# Patient Record
Sex: Male | Born: 1970
Health system: Southern US, Community
[De-identification: ages and names within clinical notes are randomized; demographics above are authoritative.]

## PROBLEM LIST (undated history)

## (undated) HISTORY — PX: WISDOM TOOTH EXTRACTION: SHX21

---

## 1996-09-21 HISTORY — PX: BACK SURGERY: SHX140

## 1999-09-09 ENCOUNTER — Encounter: Payer: Self-pay | Admitting: Orthopedic Surgery

## 1999-09-09 ENCOUNTER — Ambulatory Visit (HOSPITAL_COMMUNITY): Admission: RE | Admit: 1999-09-09 | Discharge: 1999-09-09 | Payer: Self-pay | Admitting: Orthopedic Surgery

## 2008-09-21 HISTORY — PX: INGUINAL HERNIA REPAIR: SUR1180

## 2017-08-24 DIAGNOSIS — Z0000 Encounter for general adult medical examination without abnormal findings: Secondary | ICD-10-CM | POA: Diagnosis not present

## 2017-11-01 DIAGNOSIS — B338 Other specified viral diseases: Secondary | ICD-10-CM | POA: Diagnosis not present

## 2018-09-01 DIAGNOSIS — Z0000 Encounter for general adult medical examination without abnormal findings: Secondary | ICD-10-CM | POA: Diagnosis not present

## 2018-09-01 DIAGNOSIS — Z13220 Encounter for screening for lipoid disorders: Secondary | ICD-10-CM | POA: Diagnosis not present

## 2018-09-23 DIAGNOSIS — S8991XA Unspecified injury of right lower leg, initial encounter: Secondary | ICD-10-CM | POA: Insufficient documentation

## 2018-09-23 DIAGNOSIS — S8991XD Unspecified injury of right lower leg, subsequent encounter: Secondary | ICD-10-CM | POA: Diagnosis not present

## 2019-09-22 HISTORY — PX: TENDON REPAIR: SHX5111

## 2020-02-02 ENCOUNTER — Encounter: Payer: Self-pay | Admitting: Gastroenterology

## 2020-02-26 ENCOUNTER — Ambulatory Visit (INDEPENDENT_AMBULATORY_CARE_PROVIDER_SITE_OTHER): Payer: 59

## 2020-02-26 ENCOUNTER — Ambulatory Visit (INDEPENDENT_AMBULATORY_CARE_PROVIDER_SITE_OTHER): Payer: 59 | Admitting: Podiatry

## 2020-02-26 ENCOUNTER — Other Ambulatory Visit: Payer: Self-pay

## 2020-02-26 ENCOUNTER — Encounter: Payer: Self-pay | Admitting: Podiatry

## 2020-02-26 DIAGNOSIS — M778 Other enthesopathies, not elsewhere classified: Secondary | ICD-10-CM

## 2020-02-26 DIAGNOSIS — M7672 Peroneal tendinitis, left leg: Secondary | ICD-10-CM | POA: Diagnosis not present

## 2020-02-26 MED ORDER — DICLOFENAC SODIUM 75 MG PO TBEC: 75.0000 mg | DELAYED_RELEASE_TABLET | Freq: Two times a day (BID) | ORAL | 1 refills | Status: DC

## 2020-02-26 NOTE — Progress Notes (Signed)
   HPI: 49 y.o. male presenting today as a new patient for evaluation of left foot and ankle pain.  Patient has been experiencing pain for the last 6-7 months.  Patient does not recall trauma to the area.  Patient experiences swelling with pain and aching in the mornings.  He states that he has a pressure-like sensation.  He is very active and exercises regularly.  He notices the pain increased during exercise.  He has been applying topical Voltaren gel 2% with no significant improvement.  He presents for further treatment evaluation  No past medical history on file.   Physical Exam: General: The patient is alert and oriented x3 in no acute distress.  Dermatology: Skin is warm, dry and supple bilateral lower extremities. Negative for open lesions or macerations.  Vascular: Palpable pedal pulses bilaterally.  Mild edema noted compared to the contralateral limb.  Capillary refill within normal limits.  Neurological: Epicritic and protective threshold grossly intact bilaterally.   Musculoskeletal Exam: Range of motion within normal limits to all pedal and ankle joints bilateral. Muscle strength 5/5 in all groups bilateral.  Tenderness to palpation along the base of the fourth metatarsal left foot.  There is also tenderness to palpation at the peroneal tubercle of the calcaneus just distal to the fibular malleolus left foot.  No tenderness to palpation along the peroneal tendons.  Radiographic Exam:  Normal osseous mineralization. Joint spaces preserved. No fracture/dislocation/boney destruction.  No evidence of stress reaction fracture based on radiographic exam  Assessment: 1.  Suspect stress fracture fourth metatarsal left foot based on clinical exam 2.  Peroneal tendinitis of the peroneal tubercle left foot   Plan of Care:  1. Patient evaluated. X-Rays reviewed.  2.  Injection of 0.5 cc Celestone Soluspan and light injected into the dorsal lateral aspect of the left midfoot as well as along  the peroneal tendon sheath left 3.  Prescription for diclofenac 75 mg 2 times daily 4.  The patient only experiences significant pain during activity.  We will avoid putting him in an immobilization cam boot at the moment.  Recommend good supportive tennis shoes 5.  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  58-year-old boy.  89-month-old girl.      Edrick Kins, DPM [facility]  Dr. Edrick Kins, DPM    [address], [place] [address]                Office [phone]  Fax [phone]

## 2020-03-04 ENCOUNTER — Telehealth: Payer: Self-pay | Admitting: Podiatry

## 2020-03-04 NOTE — Telephone Encounter (Signed)
Stop the medicine and prescribe Mobic 7.5 mg tablet  BID.

## 2020-03-04 NOTE — Telephone Encounter (Signed)
Pt called stating that the medication that evans gave  Diclofenac pt is experiencing light head and did stop the medication on 03/02/20 would like something else to take if possible

## 2020-03-05 MED ORDER — MELOXICAM 7.5 MG PO TABS: 7.5000 mg | ORAL_TABLET | Freq: Every day | ORAL | 1 refills | Status: DC

## 2020-03-05 NOTE — Addendum Note (Signed)
Addended by: Harriett Sine D on: 03/05/2020 08:02 AM   Modules accepted: Orders

## 2020-03-05 NOTE — Telephone Encounter (Signed)
Left message informing pt of Dr. Burnell Blanks orders and to take at bedtime.

## 2020-03-06 ENCOUNTER — Encounter: Payer: Self-pay | Admitting: Gastroenterology

## 2020-05-08 ENCOUNTER — Other Ambulatory Visit: Payer: Self-pay | Admitting: Podiatry

## 2020-05-08 NOTE — Telephone Encounter (Signed)
refill 

## 2020-05-29 ENCOUNTER — Ambulatory Visit: Payer: 59 | Admitting: Podiatry

## 2020-06-04 ENCOUNTER — Other Ambulatory Visit: Payer: Self-pay | Admitting: Podiatry

## 2020-06-05 NOTE — Telephone Encounter (Signed)
Please advise 

## 2020-06-10 NOTE — Telephone Encounter (Signed)
Please advise 

## 2020-07-15 ENCOUNTER — Ambulatory Visit (INDEPENDENT_AMBULATORY_CARE_PROVIDER_SITE_OTHER): Payer: 59 | Admitting: Podiatry

## 2020-07-15 ENCOUNTER — Other Ambulatory Visit: Payer: Self-pay

## 2020-07-15 DIAGNOSIS — S93402A Sprain of unspecified ligament of left ankle, initial encounter: Secondary | ICD-10-CM

## 2020-07-15 DIAGNOSIS — M7672 Peroneal tendinitis, left leg: Secondary | ICD-10-CM | POA: Diagnosis not present

## 2020-07-15 MED ORDER — MELOXICAM 15 MG PO TABS: 15.0000 mg | ORAL_TABLET | Freq: Every day | ORAL | 1 refills | Status: DC

## 2020-07-15 NOTE — Progress Notes (Signed)
   HPI: 49 y.o. male presenting today for follow-up evaluation of left foot and ankle pain.  Patient states that the injection helped significantly with his left foot and ankle pain however over the past month he went on half marathon.  After completion of the marathon he did well with some moderate soreness however this past Wednesday, 07/10/2020, he was on a short run when he heard an audible pop and felt a pop to his left ankle lateral aspect.  Immediately he noticed severe pain and tenderness.  He presents for further treatment and evaluation.  Today he is wearing tennis shoes and walking with an antalgic gait  No past medical history on file.   Physical Exam: General: The patient is alert and oriented x3 in no acute distress.  Dermatology: Skin is warm, dry and supple bilateral lower extremities. Negative for open lesions or macerations.  Vascular: Palpable pedal pulses bilaterally.  Mild edema noted compared to the contralateral limb.  Capillary refill within normal limits.  Neurological: Epicritic and protective threshold grossly intact bilaterally.   Musculoskeletal Exam: Range of motion within normal limits to all pedal and ankle joints bilateral. Muscle strength 5/5 in all groups bilateral.  Tenderness to palpation along the base of the fourth metatarsal left foot extending up to the fibular malleolus.  There is also tenderness to palpation at the peroneal tubercle of the calcaneus just distal to the fibular malleolus left foot.  No tenderness to palpation along the peroneal tendons.  Assessment: 1.  Acute on chronic ankle pain left 2.  Peroneal tendinitis of the peroneal tubercle left foot  Plan of Care:  1. Patient evaluated.   2.  Today we are going to order an MRI of the left ankle to rule out any ligamentous or tendon injury.  Medically necessary. 3.  Refill prescription for meloxicam 15 mg daily 4.  In the meantime wear good supportive shoes and continue conservative treatment  including icing and elevation with rest 5.  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  77-year-old boy.  19-month-old girl.  Wife is from [place]      Edrick Kins, DPM [facility]  Dr. Edrick Kins, DPM    [address], [place] [address]                Office [phone]  Fax [phone]

## 2020-07-25 ENCOUNTER — Other Ambulatory Visit: Payer: Self-pay

## 2020-07-25 ENCOUNTER — Ambulatory Visit
Admission: RE | Admit: 2020-07-25 | Discharge: 2020-07-25 | Disposition: A | Discharge status: Home / Self Care | Payer: 59 | Source: Ambulatory Visit | Attending: Podiatry | Admitting: Podiatry

## 2020-07-25 DIAGNOSIS — S93402A Sprain of unspecified ligament of left ankle, initial encounter: Secondary | ICD-10-CM

## 2020-07-25 DIAGNOSIS — M7672 Peroneal tendinitis, left leg: Secondary | ICD-10-CM

## 2020-07-26 ENCOUNTER — Other Ambulatory Visit: Payer: 59

## 2020-07-31 ENCOUNTER — Telehealth: Payer: Self-pay

## 2020-07-31 ENCOUNTER — Ambulatory Visit (INDEPENDENT_AMBULATORY_CARE_PROVIDER_SITE_OTHER): Payer: 59 | Admitting: Podiatry

## 2020-07-31 DIAGNOSIS — M7672 Peroneal tendinitis, left leg: Secondary | ICD-10-CM | POA: Diagnosis not present

## 2020-07-31 DIAGNOSIS — S86312A Strain of muscle(s) and tendon(s) of peroneal muscle group at lower leg level, left leg, initial encounter: Secondary | ICD-10-CM | POA: Diagnosis not present

## 2020-07-31 NOTE — Progress Notes (Signed)
   HPI: 49 y.o. male presenting today for follow-up evaluation of left lateral foot pain. Patient had MRI  No past medical history on file.   Physical Exam: General: The patient is alert and oriented x3 in no acute distress.  Dermatology: Skin is warm, dry and supple bilateral lower extremities. Negative for open lesions or macerations.  Vascular: Palpable pedal pulses bilaterally.  Mild edema noted compared to the contralateral limb.  Capillary refill within normal limits.  Neurological: Epicritic and protective threshold grossly intact bilaterally.   Musculoskeletal Exam: Range of motion within normal limits to all pedal and ankle joints bilateral. Muscle strength 5/5 in all groups bilateral.  Tenderness to palpation along the base of the fourth metatarsal left foot extending up to the fibular malleolus.  There is also tenderness to palpation at the peroneal tubercle of the calcaneus just distal to the fibular malleolus left foot.  No tenderness to palpation along the peroneal tendons.  MRI Impression taken 07/25/2020:  Peroneal: Peroneal longus tendinosis with high-grade near complete tear spanning a length of 3.4 cm from just inferior to the lateral malleolus to just distal to the peroneal tubercle. Peroneal brevis Intact. 1. Peroneal longus tendinosis with high-grade near complete tear spanning a length of 3.4 cm from just inferior to the lateral malleolus to just distal to the peroneal tubercle.  Assessment: 1.  Acute on chronic ankle pain left 2.  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 Patient evaluated.  MRI reviewed today 2. Today we discussed the conservative versus surgical management of the presenting pathology. The patient opts for surgical management. All possible complications and details of the procedure were explained. All patient questions were answered. No guarantees were expressed or implied. 3. Authorization for surgery was initiated  today. Surgery will consist of primary repair of peroneus longus tendon with possible application of amniotic tissue graft 4.  Return to clinic 1 week postop   *Stay-at-home dad.  36-year-old boy.  20-month-old girl.  Wife is from [place]      Edrick Kins, DPM [facility]  Dr. Edrick Kins, DPM    [address], [place] [address]                Office [phone]  Fax [phone]

## 2020-07-31 NOTE — Telephone Encounter (Signed)
DOS 08/01/2020  REPAIR TENDON LT - 28086  [employer] EFFECTIVE DATE - 01/12/2020  PLAN DEDUCTIBLE - $0.00 OUT OF POCKET - $0.00 COPAY $0.00 COINSURANCE - 20%  Notification or Prior Authorization is not required for the requested services  This [employer] plan does not currently require a prior authorization for these services. If you have general questions about the prior authorization requirements, please call us at [phone] or visit [email or web address] > Clinician Resources > Advance and Admission Notification Requirements. The number above acknowledges your notification. Please write this number down for future reference. Notification is not a guarantee of coverage or payment.  Decision ID #:[id number]

## 2020-08-01 ENCOUNTER — Other Ambulatory Visit: Payer: Self-pay | Admitting: Podiatry

## 2020-08-01 DIAGNOSIS — M7672 Peroneal tendinitis, left leg: Secondary | ICD-10-CM

## 2020-08-01 DIAGNOSIS — S86312A Strain of muscle(s) and tendon(s) of peroneal muscle group at lower leg level, left leg, initial encounter: Secondary | ICD-10-CM

## 2020-08-01 MED ORDER — OXYCODONE-ACETAMINOPHEN 5-325 MG PO TABS: 1.0000 | ORAL_TABLET | ORAL | 0 refills | Status: DC | PRN

## 2020-08-01 NOTE — Progress Notes (Signed)
PRN postop 

## 2020-08-07 ENCOUNTER — Ambulatory Visit (INDEPENDENT_AMBULATORY_CARE_PROVIDER_SITE_OTHER): Payer: 59 | Admitting: Podiatry

## 2020-08-07 ENCOUNTER — Other Ambulatory Visit: Payer: Self-pay

## 2020-08-07 ENCOUNTER — Ambulatory Visit (INDEPENDENT_AMBULATORY_CARE_PROVIDER_SITE_OTHER): Payer: 59

## 2020-08-07 DIAGNOSIS — M7672 Peroneal tendinitis, left leg: Secondary | ICD-10-CM

## 2020-08-07 DIAGNOSIS — S86312A Strain of muscle(s) and tendon(s) of peroneal muscle group at lower leg level, left leg, initial encounter: Secondary | ICD-10-CM

## 2020-08-07 DIAGNOSIS — Z9889 Other specified postprocedural states: Secondary | ICD-10-CM

## 2020-08-07 NOTE — Progress Notes (Signed)
   Subjective:  Patient presents today status post peroneus longus tendon repair/tenodesis left ankle. DOS: 08/01/2020.  Patient states that he is doing very well.  He has been nonweightbearing in the cam boot with the assistance of crutches.  He states that he currently is not taking any opioid medication.  Otherwise doing very well and he presents for further treatment and evaluation.  No new complaints at this time  No past medical history on file.    Objective/Physical Exam Neurovascular status intact.  Skin incisions appear to be well coapted with staples intact. No sign of infectious process noted. No dehiscence. No active bleeding noted.  Some moderate ecchymosis noted throughout the foot and ankle.  Moderate edema noted to the surgical extremity.  Radiographic Exam:  Otherwise normal exam.  Joint spaces preserved.  No new findings  Assessment: 1. s/p peroneal tenodesis secondary to peroneus longus rupture left. DOS: 08/01/2020   Plan of Care:  1. Patient was evaluated. X-rays reviewed 2.  Continue nonweightbearing in the cam boot with the assistance of crutches 3.  Dressings applied today.  Keep clean dry and intact x1 week 4.  Return to clinic in 1 week for staple removal  *Stay-at-home dad.  49-year-old boy.  50-month-old girl.  Wife is from [place]       Edrick Kins, DPM [facility]  Dr. Edrick Kins, DPM    [address], [place] [address]                Office [phone]  Fax [phone]

## 2020-08-14 ENCOUNTER — Other Ambulatory Visit: Payer: Self-pay

## 2020-08-14 ENCOUNTER — Ambulatory Visit (INDEPENDENT_AMBULATORY_CARE_PROVIDER_SITE_OTHER): Payer: 59 | Admitting: Podiatry

## 2020-08-14 DIAGNOSIS — S86312A Strain of muscle(s) and tendon(s) of peroneal muscle group at lower leg level, left leg, initial encounter: Secondary | ICD-10-CM

## 2020-08-14 DIAGNOSIS — M7672 Peroneal tendinitis, left leg: Secondary | ICD-10-CM

## 2020-08-14 DIAGNOSIS — Z9889 Other specified postprocedural states: Secondary | ICD-10-CM

## 2020-08-20 ENCOUNTER — Telehealth: Payer: Self-pay | Admitting: *Deleted

## 2020-08-20 NOTE — Telephone Encounter (Signed)
Patient is calling, has some concerns about his post op foot after surgery, thinks that it may be swelling more than it should.  He has an upcoming appointment next week. Should he be seen sooner?

## 2020-08-21 NOTE — Progress Notes (Signed)
   Subjective:  Patient presents today status post peroneus longus tendon repair/tenodesis left ankle. DOS: 08/01/2020.  Patient states that he is doing well.  He has been nonweightbearing in the cam boot with the assistance of crutches.  Minimal pain noted.  No new complaints at this time  No past medical history on file.    Objective/Physical Exam Neurovascular status intact.  Skin incisions appear to be well coapted with staples intact. No sign of infectious process noted. No dehiscence. No active bleeding noted.  Some moderate ecchymosis noted throughout the foot and ankle.  Moderate edema noted to the surgical extremity.  Assessment: 1. s/p peroneal tenodesis secondary to peroneus longus rupture left. DOS: 08/01/2020   Plan of Care:  1. Patient was evaluated.  Staples removed today 2.  Continue nonweightbearing in the cam boot with the assistance of crutches 3.  Recommend Ace wrap daily.  Ace wraps were provided today 4.  Return to clinic in 2 weeks to begin weightbearing in the cam boot and initiate physical therapy  *Stay-at-home dad.  49-year-old boy.  50-month-old girl.  Wife is from [place]       Edrick Kins, DPM [facility]  Dr. Edrick Kins, DPM    [address], [place] [address]                Office [phone]  Fax [phone]

## 2020-08-28 ENCOUNTER — Ambulatory Visit (INDEPENDENT_AMBULATORY_CARE_PROVIDER_SITE_OTHER): Payer: 59 | Admitting: Podiatry

## 2020-08-28 ENCOUNTER — Ambulatory Visit (INDEPENDENT_AMBULATORY_CARE_PROVIDER_SITE_OTHER): Payer: 59

## 2020-08-28 ENCOUNTER — Other Ambulatory Visit: Payer: Self-pay

## 2020-08-28 DIAGNOSIS — Z9889 Other specified postprocedural states: Secondary | ICD-10-CM

## 2020-08-28 DIAGNOSIS — M7672 Peroneal tendinitis, left leg: Secondary | ICD-10-CM

## 2020-08-28 DIAGNOSIS — S86312A Strain of muscle(s) and tendon(s) of peroneal muscle group at lower leg level, left leg, initial encounter: Secondary | ICD-10-CM

## 2020-08-28 NOTE — Progress Notes (Addendum)
   Subjective:  Patient presents today status post peroneus longus tendon repair/tenodesis left ankle. DOS: 08/01/2020.  Patient states that he is doing well.  No new complaints at this time.  He has been nonweightbearing in the cam boot using crutches as instructed. No past medical history on file.    Objective/Physical Exam Neurovascular status intact.  Skin incisions appear to be well coapted and healed. No sign of infectious process noted. No dehiscence. No active bleeding noted.  Some moderate ecchymosis noted throughout the foot and ankle.  Moderate edema noted to the surgical extremity.  Assessment: 1. s/p peroneal tenodesis secondary to peroneus longus rupture left. DOS: 08/01/2020   Plan of Care:  1. Patient was evaluated.  Patient may now transition from nonweightbearing to weightbearing in the cam boot over 2 to 3-week..  Begin passive range of motion exercises 2.  Continue Ace wrap daily 3.  Patient may begin partial weightbearing in the cam boot 4.  Order placed for physical therapy at [facility] 5.  Return to clinic in 1 month  *Stay-at-home dad.  49-year-old boy.  93-month-old girl.  Wife is from [place]       Edrick Kins, DPM [facility]  Dr. Edrick Kins, DPM    [address], [place] [address]                Office [phone]  Fax [phone]

## 2020-08-28 NOTE — Addendum Note (Signed)
Addended by: Edrick Kins on: 08/28/2020 03:09 PM   Modules accepted: Level of Service

## 2020-08-28 NOTE — Addendum Note (Signed)
Addended by: Lind Guest on: 08/28/2020 04:46 PM   Modules accepted: Orders

## 2020-09-23 ENCOUNTER — Other Ambulatory Visit: Payer: Self-pay | Admitting: Podiatry

## 2020-09-25 ENCOUNTER — Ambulatory Visit (INDEPENDENT_AMBULATORY_CARE_PROVIDER_SITE_OTHER): Payer: 59 | Admitting: Podiatry

## 2020-09-25 ENCOUNTER — Other Ambulatory Visit: Payer: Self-pay

## 2020-09-25 DIAGNOSIS — Z9889 Other specified postprocedural states: Secondary | ICD-10-CM

## 2020-09-25 DIAGNOSIS — M7672 Peroneal tendinitis, left leg: Secondary | ICD-10-CM

## 2020-09-29 NOTE — Telephone Encounter (Signed)
Please advise 

## 2020-10-13 NOTE — Progress Notes (Signed)
   Subjective:  Patient presents today status post peroneus longus tendon repair/tenodesis left ankle. DOS: 08/01/2020.  Patient states that he is doing well.  No new complaints at this time.  He continues to be weightbearing in the cam boot as instructed  No past medical history on file.    Objective/Physical Exam Neurovascular status intact.  Skin incisions appear to be well coapted and healed. No sign of infectious process noted. No dehiscence. No active bleeding noted.  Some moderate ecchymosis noted throughout the foot and ankle.  Moderate edema noted to the surgical extremity.  Assessment: 1. s/p peroneal tenodesis secondary to peroneus longus rupture left. DOS: 08/01/2020   Plan of Care:  1. Patient was evaluated.  2.  Patient may transition out of the cam boot 3.  Ankle brace dispensed.  Wear daily 4.  Continue physical therapy at home 5.  Compression ankle sleeve dispensed.  Wear daily as needed 6.  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  50-year-old boy.  51-month-old girl.  Wife is from [place]       Edrick Kins, DPM [facility]  Dr. Edrick Kins, DPM    [address], [place] [address]                Office [phone]  Fax [phone]

## 2020-10-28 ENCOUNTER — Other Ambulatory Visit: Payer: Self-pay

## 2020-10-28 ENCOUNTER — Ambulatory Visit (INDEPENDENT_AMBULATORY_CARE_PROVIDER_SITE_OTHER): Payer: 59 | Admitting: Podiatry

## 2020-10-28 DIAGNOSIS — Z9889 Other specified postprocedural states: Secondary | ICD-10-CM

## 2020-10-28 DIAGNOSIS — M7672 Peroneal tendinitis, left leg: Secondary | ICD-10-CM

## 2020-10-28 NOTE — Progress Notes (Signed)
   Subjective:  Patient presents today status post peroneus longus tendon repair/tenodesis left ankle. DOS: 08/01/2020.  Patient is doing very well.  He has been weightbearing wearing tennis shoes with an ankle brace.  He states that yesterday he was able to walk approximately 1.7 miles with minimal pain and some stiffness to the ankle.  No new complaints at this time  No past medical history on file.    Objective/Physical Exam Neurovascular status intact.  Skin incisions appear to be well coapted and healed. No sign of infectious process noted. No dehiscence. No active bleeding noted.  There continues to be some very mild edema throughout the foot ankle and leg of the surgical extremity.  Muscle strength 5/5 all compartments.  There is no pain on palpation to the peroneal tendons  Assessment: 1. s/p peroneal tenodesis secondary to peroneus longus rupture left. DOS: 08/01/2020   Plan of Care:  1. Patient was evaluated.  2.  Patient may now transition out of the ankle brace.  Recommend good supportive sneakers and shoes 3.  Patient may slowly increase walking up to 3 miles per day on flat surfaces/pavement 4.  Continue calf strengthening exercises at home 5.  Return to clinic in 2 months for final follow-up  *Going to [place], [place] mid-April *Stay-at-home dad.  50-year-old boy.  67-month-old girl.  Wife is from [place]       Edrick Kins, DPM [facility]  Dr. Edrick Kins, DPM    [address], [place] [address]                Office [phone]  Fax [phone]

## 2020-12-23 ENCOUNTER — Other Ambulatory Visit: Payer: Self-pay

## 2020-12-23 ENCOUNTER — Ambulatory Visit (INDEPENDENT_AMBULATORY_CARE_PROVIDER_SITE_OTHER): Payer: 59 | Admitting: Podiatry

## 2020-12-23 DIAGNOSIS — Z9889 Other specified postprocedural states: Secondary | ICD-10-CM | POA: Diagnosis not present

## 2020-12-23 DIAGNOSIS — M7672 Peroneal tendinitis, left leg: Secondary | ICD-10-CM | POA: Diagnosis not present

## 2020-12-23 NOTE — Progress Notes (Signed)
   Subjective:  Patient presents today status post peroneus longus tendon repair/tenodesis left ankle. DOS: 08/01/2020.  Patient is doing very well.  He is most recently began exercising and running.  He does notice some stiffness however overall there is significant improvement.  No new complaints at this time  No past medical history on file.    Objective/Physical Exam Neurovascular status intact.  Skin incisions appear to be well coapted and healed. No sign of infectious process noted. No dehiscence. No active bleeding noted.  There continues to be some very mild edema throughout the foot ankle and leg of the surgical extremity.  Muscle strength 5/5 all compartments.  There is no pain on palpation to the peroneal tendons  Assessment: 1. s/p peroneal tenodesis secondary to peroneus longus rupture left. DOS: 08/01/2020   Plan of Care:  1. Patient was evaluated.  2.  Patient may now resume full activity no restrictions in good supportive sneakers and shoes 3.  Slowly increase activity.  Explained to the patient he may never be completely 100% compared to his contralateral limb and he will have to learn his own limits when it comes to activities.  He may need to modify certain activities. 4.  Return to clinic as needed  *Stay-at-home dad.  50-year-old boy.  51-month-old girl.  Wife is from [place]       Edrick Kins, DPM [facility]  Dr. Edrick Kins, DPM    [address], [place] [address]                Office [phone]  Fax [phone]

## 2021-01-09 ENCOUNTER — Encounter: Payer: Self-pay | Admitting: Gastroenterology

## 2021-01-29 IMAGING — MR MR ANKLE*L* W/O CM
5 series · 40 of 40 positions shown · non-contrast
Comparison: Left foot x-rays dated February 26, 2020.

CLINICAL DATA: Acute on chronic lateral ankle pain. Felt a pop in
his lateral ankle while running 2 weeks ago.

EXAM:
MRI OF THE LEFT ANKLE WITHOUT CONTRAST
TECHNIQUE: Multiplanar, multisequence MR imaging of the ankle was performed. No
intravenous contrast was administered.

[Series 4: T2 fat-sat · axial · 3.0mm · 0.50mm/px · z∈[-103,+34]mm · 10 of 36 slices shown (1 of 2)]
[im 1/36]
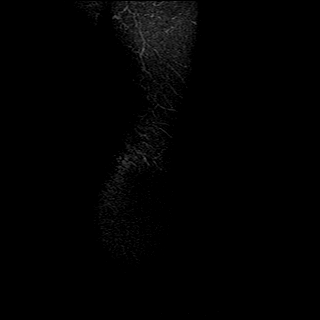
[im 4/36]
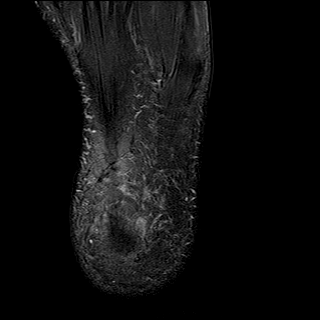
[im 8/36]
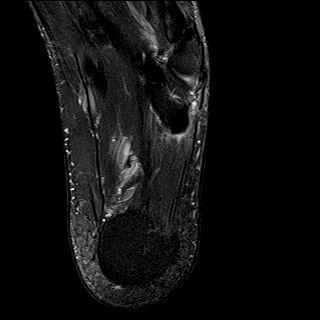
[im 12/36]
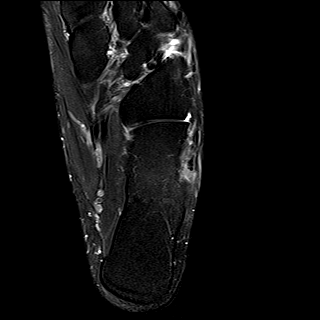
[im 16/36]
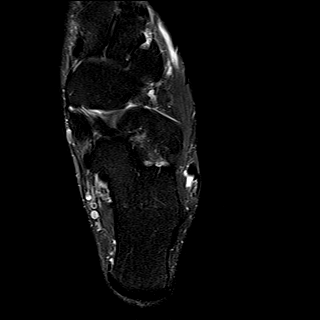
[im 20/36]
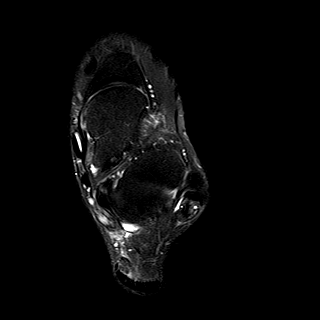
[im 24/36]
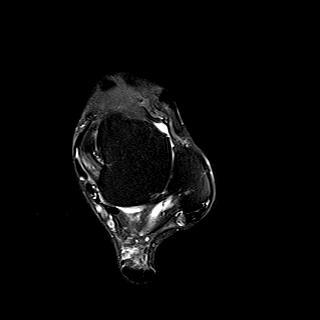
[im 28/36]
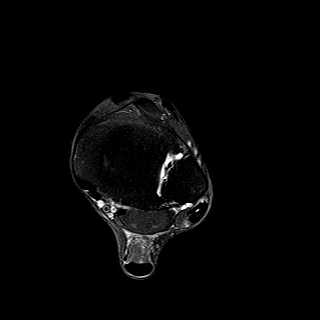
[im 32/36]
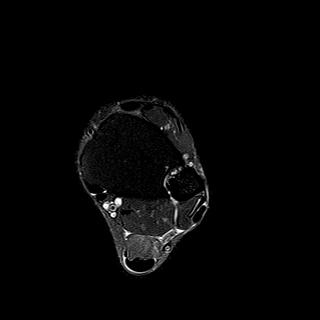
[im 36/36]
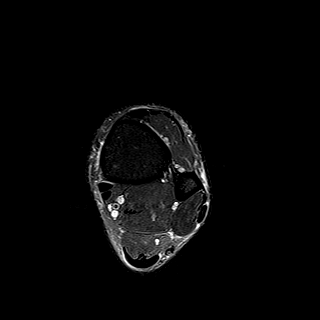

[Series 5: PD fat-sat · axial · 3.0mm · 0.50mm/px · z∈[-103,+34]mm · 10 of 36 slices shown]
[im 1/36]
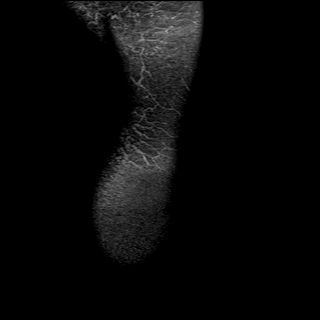
[im 4/36]
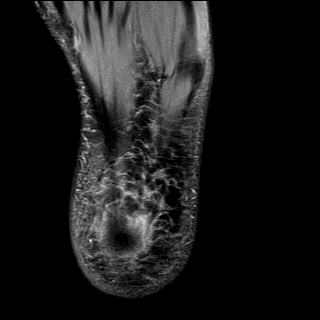
[im 8/36]
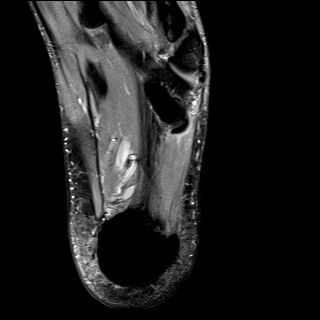
[im 12/36]
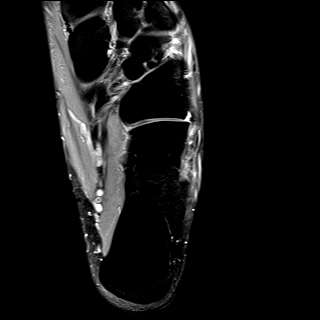
[im 16/36]
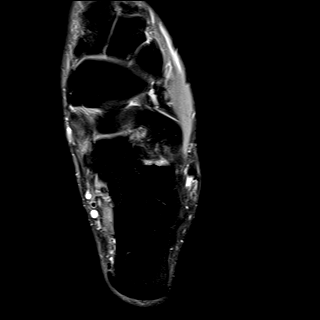
[im 20/36]
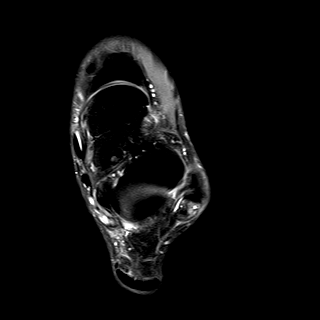
[im 24/36]
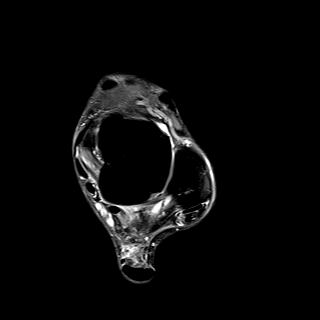
[im 28/36]
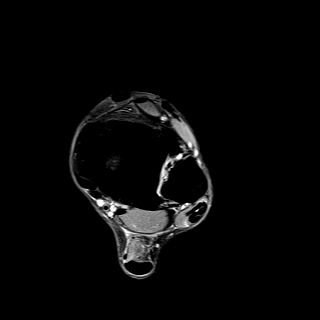
[im 32/36]
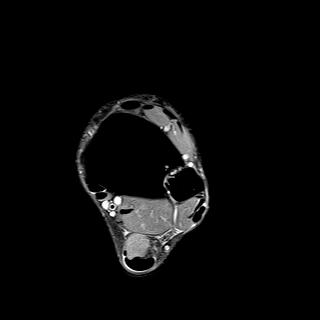
[im 36/36]
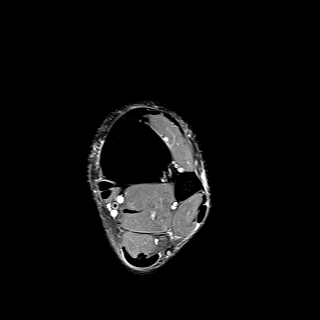

[Series 6: T1 · sagittal · 4.0mm · 0.56mm/px · 5 of 20 slices shown]
[im 1/20]
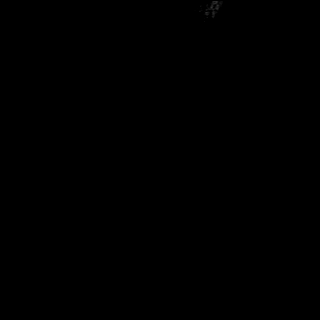
[im 5/20]
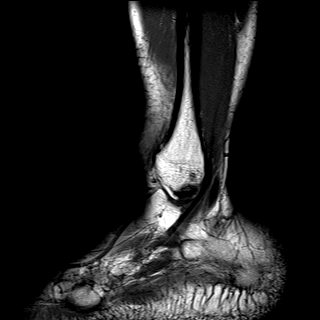
[im 10/20]
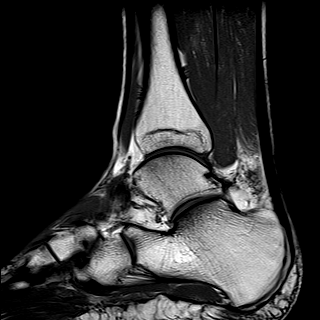
[im 15/20]
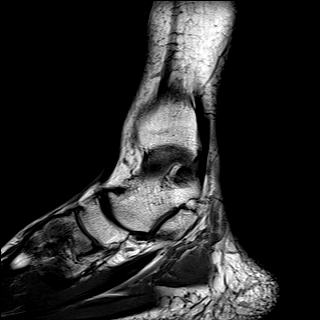
[im 20/20]
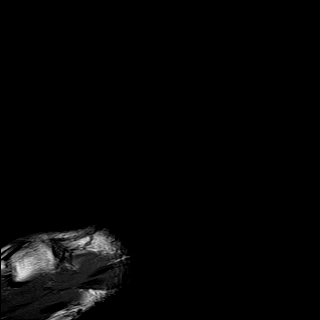

[Series 7: STIR · sagittal · 4.0mm · 0.35mm/px · 5 of 20 slices shown]
[im 1/20]
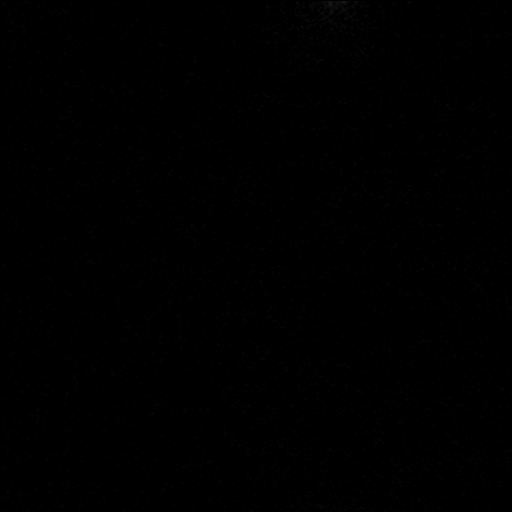
[im 5/20]
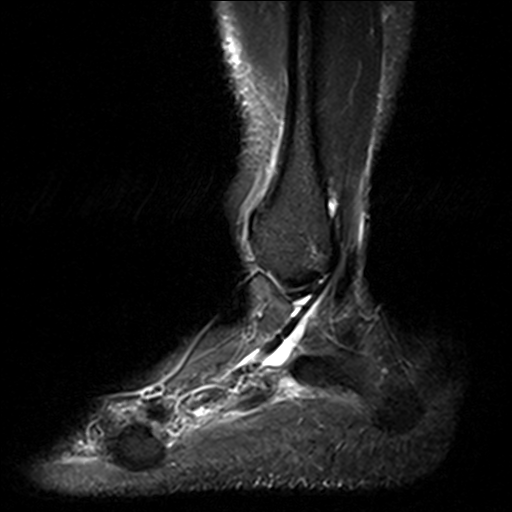
[im 10/20]
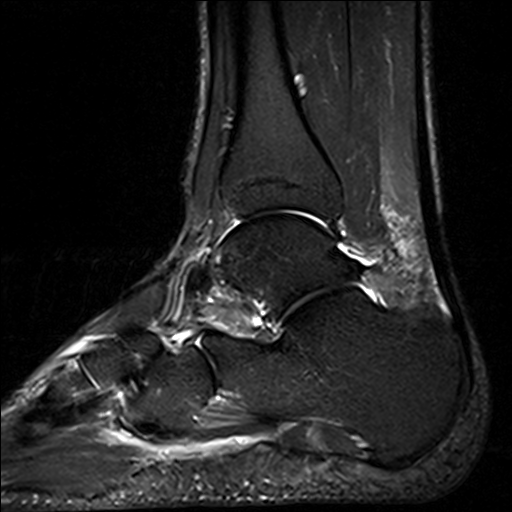
[im 15/20]
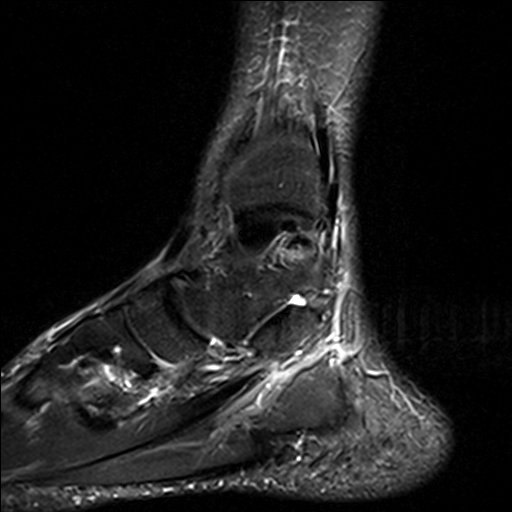
[im 20/20]
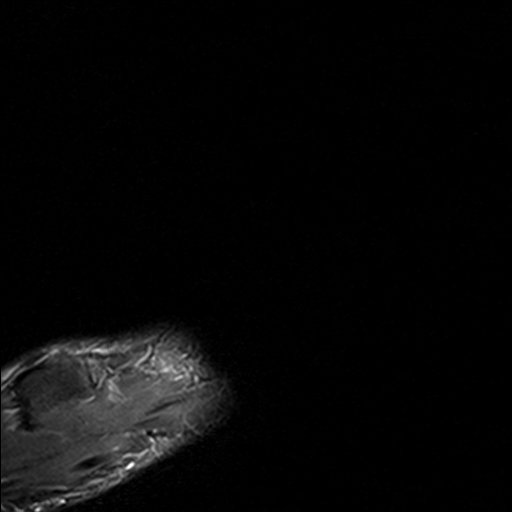

[Series 8: T2 fat-sat · coronal · 3.0mm · 0.50mm/px · 10 of 35 slices shown (2 of 2)]
[im 1/35]
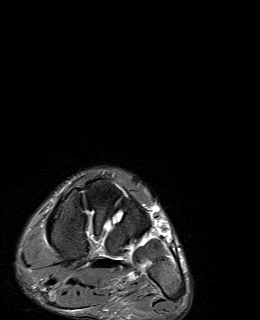
[im 4/35]
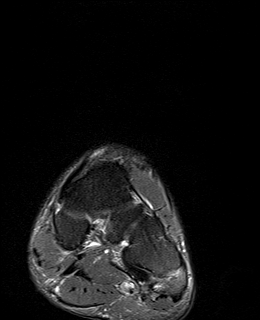
[im 8/35]
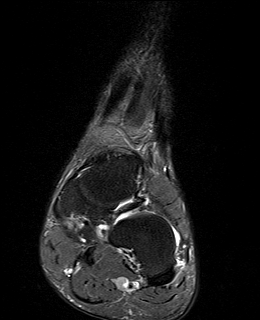
[im 12/35]
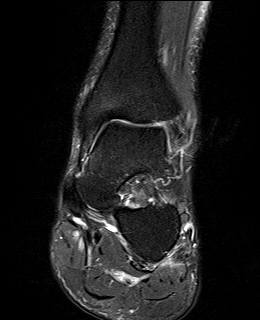
[im 16/35]
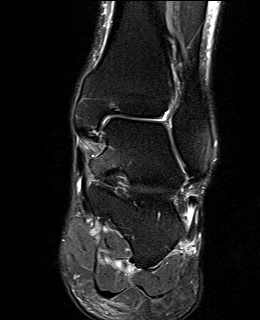
[im 19/35]
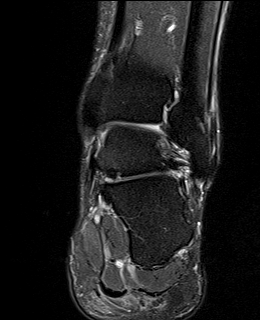
[im 23/35]
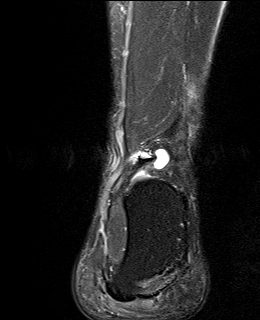
[im 27/35]
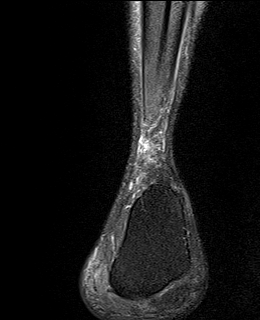
[im 31/35]
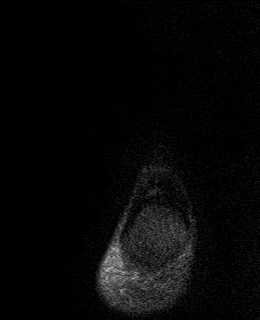
[im 35/35]
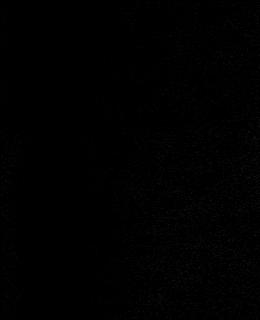

[40 of 40 positions shown; findings below may reference images not displayed]

FINDINGS: TENDONS

Peroneal: Peroneal longus tendinosis with high-grade near complete
tear spanning a length of 3.4 cm from just inferior to the lateral
malleolus to just distal to the peroneal tubercle. Peroneal brevis
intact.

Posteromedial: Posterior tibial tendon intact. Flexor hallucis
longus tendon intact. Flexor digitorum longus tendon intact.

Anterior: Tibialis anterior tendon intact. Extensor hallucis longus
tendon intact Extensor digitorum longus tendon intact.

Achilles:  Intact.

Plantar Fascia: Intact.

LIGAMENTS

Lateral: Anterior talofibular ligament intact. Calcaneofibular
ligament intact. Posterior talofibular ligament intact. Anterior and
posterior tibiofibular ligaments intact.

Medial: Deltoid ligament intact. Spring ligament intact.

CARTILAGE

Ankle Joint: No joint effusion. Normal ankle mortise. No chondral
defect.

Subtalar Joints/Sinus Tarsi: Normal subtalar joints. No subtalar
joint effusion. Normal sinus tarsi.

Bones: No marrow signal abnormality.  No fracture or dislocation.

Soft Tissue: No soft tissue mass or fluid collection.
IMPRESSION: 1. Peroneal longus tendinosis with high-grade near complete tear
spanning a length of 3.4 cm from just inferior to the lateral
malleolus to just distal to the peroneal tubercle.

## 2021-03-03 ENCOUNTER — Ambulatory Visit (AMBULATORY_SURGERY_CENTER): Payer: 59

## 2021-03-03 ENCOUNTER — Other Ambulatory Visit: Payer: Self-pay

## 2021-03-03 VITALS — Ht 73.0 in | Wt 215.0 lb

## 2021-03-03 DIAGNOSIS — Z1211 Encounter for screening for malignant neoplasm of colon: Secondary | ICD-10-CM

## 2021-03-03 MED ORDER — SUTAB 1479-225-188 MG PO TABS: 1.0000 | ORAL_TABLET | ORAL | 0 refills | Status: DC

## 2021-03-03 NOTE — Progress Notes (Signed)
Pre visit completed via phone call; Patient verified name, DOB, and address; No egg or soy allergy known to patient  No issues with past sedation with any surgeries or procedures Patient denies ever being told they had issues or difficulty with intubation  No FH of Malignant Hyperthermia No diet pills per patient No home 02 use per patient  No blood thinners per patient  Pt denies issues with constipation at this time; No A fib or A flutter  EMMI video via [place] guidelines implemented in [place] today with Pt and RN   Coupon given to pt in PV today, Code to Pharmacy and NO PA's for preps discussed with pt in PV today  Discussed with pt there will be an out-of-pocket cost for prep and that varies from $0 to 70 dollars   Due to the COVID-19 pandemic we are asking patients to follow certain guidelines.  Pt aware of COVID protocols and [facility] guidelines

## 2021-03-13 ENCOUNTER — Ambulatory Visit (AMBULATORY_SURGERY_CENTER): Payer: 59 | Admitting: Gastroenterology

## 2021-03-13 ENCOUNTER — Encounter: Payer: Self-pay | Admitting: Gastroenterology

## 2021-03-13 ENCOUNTER — Other Ambulatory Visit: Payer: Self-pay

## 2021-03-13 VITALS — BP 111/73 | HR 48 | Temp 97.8°F | Resp 19 | Ht 73.0 in | Wt 215.0 lb

## 2021-03-13 DIAGNOSIS — D123 Benign neoplasm of transverse colon: Secondary | ICD-10-CM | POA: Diagnosis not present

## 2021-03-13 DIAGNOSIS — Z1211 Encounter for screening for malignant neoplasm of colon: Secondary | ICD-10-CM

## 2021-03-13 MED ORDER — SODIUM CHLORIDE 0.9 % IV SOLN: 500.0000 mL | Freq: Once | INTRAVENOUS | Status: DC

## 2021-03-13 NOTE — Progress Notes (Signed)
Called to room to assist during endoscopic procedure.  Patient ID and intended procedure confirmed with present staff. Received instructions for my participation in the procedure from the performing physician.  

## 2021-03-13 NOTE — Progress Notes (Signed)
Medical history reviewed with no changes noted. VS assessed by C.W 

## 2021-03-13 NOTE — Op Note (Signed)
[place] Patient Name: Roger Melendez Procedure Date: 03/13/2021 11:23 AM MRN: [id number] Endoscopist: Remo Lipps P. Havery Moros , MD Age: 50 Referring MD:  Date of Birth: 1970-10-02 Gender: Male Account #: [id number] Procedure:                Colonoscopy Indications:              Screening for colorectal malignant neoplasm, This                            is the patient's first colonoscopy Medicines:                Monitored Anesthesia Care Procedure:                Pre-Anesthesia Assessment:                           - Prior to the procedure, a History and Physical                            was performed, and patient medications and                            allergies were reviewed. The patient's tolerance of                            previous anesthesia was also reviewed. The risks                            and benefits of the procedure and the sedation                            options and risks were discussed with the patient.                            All questions were answered, and informed consent                            was obtained. Prior Anticoagulants: The patient has                            taken no previous anticoagulant or antiplatelet                            agents. ASA Grade Assessment: I - A normal, healthy                            patient. After reviewing the risks and benefits,                            the patient was deemed in satisfactory condition to                            undergo the procedure.  After obtaining informed consent, the colonoscope                            was passed under direct vision. Throughout the                            procedure, the patient's blood pressure, pulse, and                            oxygen saturations were monitored continuously. The                            CF [id number] #[id number] was introduced through the anus                            and advanced to the the  cecum, identified by                            appendiceal orifice and ileocecal valve. The                            colonoscopy was performed without difficulty. The                            patient tolerated the procedure well. The quality                            of the bowel preparation was adequate. The                            ileocecal valve, appendiceal orifice, and rectum                            were photographed. Scope In: 12:15:19 PM Scope Out: 12:34:49 PM Scope Withdrawal Time: 0 hours 15 minutes 9 seconds  Total Procedure Duration: 0 hours 19 minutes 30 seconds  Findings:                 The perianal and digital rectal examinations were                            normal.                           A 3 mm polyp was found in the transverse colon. The                            polyp was sessile. The polyp was removed with a                            cold snare. Resection and retrieval were complete.                           The exam was otherwise without abnormality. Complications:  No immediate complications. Estimated blood loss:                            Minimal. Estimated Blood Loss:     Estimated blood loss was minimal. Impression:               - One 3 mm polyp in the transverse colon, removed                            with a cold snare. Resected and retrieved.                           - The examination was otherwise normal. Recommendation:           - Patient has a contact number available for                            emergencies. The signs and symptoms of potential                            delayed complications were discussed with the                            patient. Return to normal activities tomorrow.                            Written discharge instructions were provided to the                            patient.                           - Resume previous diet.                           - Continue present medications.                            - Await pathology results. Remo Lipps P. Cristo Ausburn, MD 03/13/2021 12:38:06 PM This report has been signed electronically.

## 2021-03-13 NOTE — Patient Instructions (Signed)
Resume previous diet and medications. Awaiting pathology results. ? ?YOU HAD AN ENDOSCOPIC PROCEDURE TODAY AT THE [facility]:   Refer to the procedure report that was given to you for any specific questions about what was found during the examination.  If the procedure report does not answer your questions, please call your gastroenterologist to clarify.  If you requested that your care partner not be given the details of your procedure findings, then the procedure report has been included in a sealed envelope for you to review at your convenience later. ? ?YOU SHOULD EXPECT: Some feelings of bloating in the abdomen. Passage of more gas than usual.  Walking can help get rid of the air that was put into your GI tract during the procedure and reduce the bloating. If you had a lower endoscopy (such as a colonoscopy or flexible sigmoidoscopy) you may notice spotting of blood in your stool or on the toilet paper. If you underwent a bowel prep for your procedure, you may not have a normal bowel movement for a few days. ? ?Please Note:  You might notice some irritation and congestion in your nose or some drainage.  This is from the oxygen used during your procedure.  There is no need for concern and it should clear up in a day or so. ? ?SYMPTOMS TO REPORT IMMEDIATELY: ? ?Following lower endoscopy (colonoscopy or flexible sigmoidoscopy): ? Excessive amounts of blood in the stool ? Significant tenderness or worsening of abdominal pains ? Swelling of the abdomen that is new, acute ? Fever of 100?F or higher ? ?For urgent or emergent issues, a gastroenterologist can be reached at any hour by calling [phone]. ?Do not use MyChart messaging for urgent concerns.  ? ? ?DIET:  We do recommend a small meal at first, but then you may proceed to your regular diet.  Drink plenty of fluids but you should avoid alcoholic beverages for 24 hours. ? ?ACTIVITY:  You should plan to take it easy for the rest of today and  you should NOT DRIVE or use heavy machinery until tomorrow (because of the sedation medicines used during the test).   ? ?FOLLOW UP: ?Our staff will call the number listed on your records 48-72 hours following your procedure to check on you and address any questions or concerns that you may have regarding the information given to you following your procedure. If we do not reach you, we will leave a message.  We will attempt to reach you two times.  During this call, we will ask if you have developed any symptoms of COVID 19. If you develop any symptoms (ie: fever, flu-like symptoms, shortness of breath, cough etc.) before then, please call [phone].  If you test positive for Covid 19 in the 2 weeks post procedure, please call and report this information to us.   ? ?If any biopsies were taken you will be contacted by phone or by letter within the next 1-3 weeks.  Please call us at [phone] if you have not heard about the biopsies in 3 weeks.  ? ? ?SIGNATURES/CONFIDENTIALITY: ?You and/or your care partner have signed paperwork which will be entered into your electronic medical record.  These signatures attest to the fact that that the information above on your After Visit Summary has been reviewed and is understood.  Full responsibility of the confidentiality of this discharge information lies with you and/or your care-partner.  ?

## 2021-03-13 NOTE — Progress Notes (Signed)
A/ox3, pleased with MAC, report to RN 

## 2021-03-13 NOTE — Progress Notes (Signed)
No problems noted in the recovery room. maw 

## 2021-03-17 ENCOUNTER — Telehealth: Payer: Self-pay

## 2021-03-17 NOTE — Telephone Encounter (Signed)
  Follow up Call-  Call back number 03/13/2021  Post procedure Call Back phone  # [phone]  Permission to leave phone message Yes  Some recent data might be hidden     Patient questions:  Do you have a fever, pain , or abdominal swelling? No. Pain Score  0 *  Have you tolerated food without any problems? Yes.    Have you been able to return to your normal activities? Yes.    Do you have any questions about your discharge instructions: Diet   No. Medications  No. Follow up visit  No.  Do you have questions or concerns about your Care? No.  Actions: * If pain score is 4 or above: No action needed, pain <4.  Have you developed a fever since your procedure? no  2.   Have you had an respiratory symptoms (SOB or cough) since your procedure? no  3.   Have you tested positive for COVID 19 since your procedure no  4.   Have you had any family members/close contacts diagnosed with the COVID 19 since your procedure?  no   If yes to any of these questions please route to Joylene John, RN and Joella Prince, RN

## 2021-12-31 ENCOUNTER — Other Ambulatory Visit: Payer: Self-pay | Admitting: Internal Medicine

## 2021-12-31 DIAGNOSIS — E781 Pure hyperglyceridemia: Secondary | ICD-10-CM

## 2022-01-29 ENCOUNTER — Ambulatory Visit
Admission: RE | Admit: 2022-01-29 | Discharge: 2022-01-29 | Disposition: A | Discharge status: Home / Self Care | Payer: No Typology Code available for payment source | Source: Ambulatory Visit | Attending: Internal Medicine | Admitting: Internal Medicine

## 2022-01-29 DIAGNOSIS — E781 Pure hyperglyceridemia: Secondary | ICD-10-CM

## 2023-02-04 ENCOUNTER — Ambulatory Visit (INDEPENDENT_AMBULATORY_CARE_PROVIDER_SITE_OTHER): Payer: 59 | Admitting: Sports Medicine

## 2023-02-04 VITALS — BP 98/70 | Ht 72.0 in | Wt 198.0 lb

## 2023-02-04 DIAGNOSIS — S76319A Strain of muscle, fascia and tendon of the posterior muscle group at thigh level, unspecified thigh, initial encounter: Secondary | ICD-10-CM | POA: Diagnosis not present

## 2023-02-04 DIAGNOSIS — S86812A Strain of other muscle(s) and tendon(s) at lower leg level, left leg, initial encounter: Secondary | ICD-10-CM

## 2023-02-04 NOTE — Progress Notes (Signed)
Subjective:    Patient ID: Roger Melendez, male    DOB: 02/27/1971, 52 y.o.   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  Patient states that since his surgical repair in 2021 he has had ongoing calf tightness that will occasionally bother him while running.  He treats issue by doing some static stretching and using lacrosse ball to massage the area.  This will help keep symptoms at bay but does not ever resolve issues.  In the last 6 months or so patient has began struggling with bilateral hamstring tightness/pain that usually occurs during running.  Describes pain is located in the middle of the hamstring muscle body.  Hurt left hamstring recently while going on a run with a friend at a faster pace than usual.  Denies any ecchymosis or erythema to area.  Since then he has discontinued running for the past 4 weeks.  Has done some static stretching to alleviate tightness and is occasionally taking ibuprofen.  No other rehab for injuries.  Review of Systems   Negative except as noted above Objective:   Physical Exam Musculoskeletal:        General: No swelling or deformity. Normal range of motion.     Comments: Hamstrings: Skin overlying area of concern on bilateral hamstring is normal.  No edmea,erythema or ecchymosis.  Mildly tender to palpation along muscle body of semimembranosus/semitendinosus bilaterally.  Range of motion WNL.  Some weakness in bilateral hamstrings with resisted leg flexion.  This does reproduce some of the pain/tightness patient feels.  L Calf: Skin overlying tenderness along left Arm.  No Edema, Erythema, Ecchymosis.  Pain Primary Located along Path of Left Plantaris and Peroneal Muscle Bodies.  Also describes Some Tightness at the  Musculotendinous Junction of Achilles Tendon.  No Tenderness with Palpation.  Fairly Good range of motion although moderately reduced dorsiflexion of the left foot due to calf tightness.  Patient neurovascularly intact.  Skin:    General: Skin is warm and dry.     Capillary Refill: Capillary refill takes less than 2 seconds.     Findings: No erythema or rash.  Neurological:     Sensory: No sensory deficit.     Motor: Weakness (L calf is weaker with resisted plantar flexion when compared to right side. Bilateral hamstring weakness with resisted leg flexion.) present.     Gait: Gait normal.         Assessment & Plan:   Bilateral hamstring strain/tendinitis Patient has limited to no symptoms at baseline but pain will recur during running over the last couple months.  Pain pattern consistent with recurrent hamstring strain/tendinitis.  Noticeable weakness assessed on physical exam.  Will initiate treatment using the following modalities: Start Ask[place] hamstring HEP -exercises demonstrated in office today Supplement left hamstring eccentric resistance training Patient recommended to start using compression of hamstring sleeve while exercising/running Can use OTC anti-inflammatories as needed Return to clinic in July after vacation for reassessment If not improving will consider sending for formal physical therapy  2.   Left calf tightness/weakness s/p peroneal longus repair 2021 Provided heel lift felt pads 5/16 inch today in office to wear while exercising Start Alfredson heel drop exercises with alternating sets between pigeon toed, neutral, and foot eversion while focusing on eccentric component  of exercise Supplement with additional strengthening exercises for gastroc/soleus Can also wear Compressive sleeve while exercising for additional support Continue with daily stretches and can supplement with lacrosse ball massage etc. Follow-up in July after vacation for reassessment Consider  PT if not improving  Scribe for this note was Havery Moros, DO, PGY 2 with edits made by me. Marsa Aris, DO

## 2024-10-12 ENCOUNTER — Ambulatory Visit (HOSPITAL_BASED_OUTPATIENT_CLINIC_OR_DEPARTMENT_OTHER): Admitting: Student

## 2024-10-12 ENCOUNTER — Ambulatory Visit (HOSPITAL_BASED_OUTPATIENT_CLINIC_OR_DEPARTMENT_OTHER)

## 2024-10-12 DIAGNOSIS — M25571 Pain in right ankle and joints of right foot: Secondary | ICD-10-CM | POA: Diagnosis not present

## 2024-10-12 DIAGNOSIS — M25561 Pain in right knee: Secondary | ICD-10-CM

## 2024-10-12 NOTE — Progress Notes (Unsigned)
 "                                Chief Complaint: Right knee and ankle pain    Discussed the use of AI scribe software for clinical note transcription with the patient, who gave verbal consent to proceed.  History of Present Illness Roger Melendez is a 54 year old male with prior left ankle peroneal tendon repair who presents with right knee pain.  Right knee pain started 5 days ago after a day of running and dancing. He developed stiffness and mild swelling at the superior knee that evening. Ibuprofen and icing provided minimal relief, and he has avoided running and strenuous activity since. Pain is a dull ache with intermittent weakness and occasional clicking. It worsens with prolonged flexion such as car sitting or squatting, and he has difficulty rising from a deep bend. Pain is not sharply localized but can be reproduced with palpation, often more at the top of the knee. Swelling fluctuates and can appear as a visible bulge above the knee after activity. He notes decreased right leg strength, with box jumps harder than on the left. He denies locking, catching, or significant stair symptoms. He has intermittent knee pain after incline or decline running that usually resolves in a few days. He denies acute trauma and has had no right knee surgery.  He has medial right ankle pain and swelling of unclear duration, with a protruding bump on the medial ankle that worsens after running or in less supportive shoes. Supportive footwear improves symptoms, and he notes his right foot is flatter. Pain does not radiate above the ankle. He has had multiple ankle sprains in the past but none recently and no right ankle surgery. He is concerned about a process similar to his prior left peroneal tendon tear that developed insidiously and required surgery.  He is normally very active in early morning group workouts with running and other exercises but has stopped running since symptom onset.   Surgical  History:   None  PMH/PSH/Family History/Social History/Meds/Allergies:   No past medical history on file. Past Surgical History:  Procedure Laterality Date   BACK SURGERY  1998   herniated disc-   INGUINAL HERNIA REPAIR Left 2010   TENDON REPAIR Left 2021   LEFT ankle tendon repair   WISDOM TOOTH EXTRACTION     Social History   Socioeconomic History   Marital status: Single    Spouse name: Not on file   Number of children: Not on file   Years of education: Not on file   Highest education level: Not on file  Occupational History   Not on file  Tobacco Use   Smoking status: Never   Smokeless tobacco: Never  Vaping Use   Vaping status: Not on file  Substance and Sexual Activity   Alcohol use: Yes    Alcohol/week: 2.0 standard drinks of alcohol    Types: 2 Standard drinks or equivalent per week   Drug use: Never   Sexual activity: Not on file  Other Topics Concern   Not on file  Social History Narrative   Not on file   Social Drivers of Health   Tobacco Use: Low Risk (09/13/2024)   Received from [facility]   Patient History    Smoking Tobacco Use: Never    Smokeless Tobacco Use: Never    Passive Exposure: Not on file  Financial Resource  Strain: Not on file  Food Insecurity: Not on file  Transportation Needs: Not on file  Physical Activity: Not on file  Stress: Not on file  Social Connections: Not on file  Depression (EYV7-0): Not on file  Alcohol Screen: Not on file  Housing: Not on file  Utilities: Not on file  Health Literacy: Not on file   Family History  Problem Relation Age of Onset   Prostate cancer Father 23   Prostate cancer Paternal Uncle 83   Colon polyps Neg Hx    Colon cancer Neg Hx    Esophageal cancer Neg Hx    Stomach cancer Neg Hx    Rectal cancer Neg Hx    Allergies[1] Current Outpatient Medications  Medication Sig Dispense Refill   Ivermectin 1 % CREA Apply topically daily.     No current facility-administered medications for  this visit.   No results found.  Review of Systems:   A ROS was performed including pertinent positives and negatives as documented in the HPI.  Physical Exam :   Constitutional: NAD and appears stated age Neurological: Alert and oriented Psych: Appropriate affect and cooperative There were no vitals taken for this visit.   Comprehensive Musculoskeletal Exam:    Right knee exam demonstrates presence of a mild effusion without erythema or warmth.  Tenderness over the medial joint line.  Full active range of motion from 0 to 130 degrees with no significant crepitus.  No laxity with varus or valgus stress.  Negative McMurray and Lachman. 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  Bony protrusion inferior to the medial malleolus with some tenderness.  No tenderness along the PT tendon distribution.  Imaging:   Xray (right knee 4 views): ***  Xray (right ankle 3 views): ***   I personally reviewed and interpreted the radiographs.      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 Conservative management is appropriate, with escalation to corticosteroid injection or MRI if necessary. Activity modification is recommended. Physical therapy is advised for strength deficits and patellofemoral syndrome if symptoms persist or worsen. A corticosteroid injection is discussed for severe or persistent pain or swelling, with the option to return for injection if needed. Monitoring for persistent swelling, mechanical symptoms, or inability to bear weight is advised, which may prompt an MRI. Findings and plans are communicated to [facility] for coordination and possible PT referral.  Medial right ankle pain with flatfoot and possible posterior tibial tendon  dysfunction He has chronic medial right ankle pain with flatfoot, raising concern for posterior tibial tendon dysfunction due to biomechanical stress. Continued use of supportive footwear and orthotics is advised to minimize pronation and reduce medial ankle stress. Monitoring for progression of symptoms, particularly signs of posterior tibial tendon dysfunction, is recommended. Further interventions such as physical therapy, shockwave therapy, or imaging are discussed if symptoms worsen or become limiting. Reporting significant changes or escalation in symptoms is encouraged for timely reassessment.      I personally saw and evaluated the patient, and participated in the management and treatment plan.  Leonce Reveal, PA-C Orthopedics    [1] No Known Allergies  "
